# Patient Record
Sex: Female | Born: 1995 | Race: White | Hispanic: No | Marital: Married | Smoking: Never smoker
Health system: Southern US, Community
[De-identification: ages and names within clinical notes are randomized; demographics above are authoritative.]

## PROBLEM LIST (undated history)

## (undated) DIAGNOSIS — F419 Anxiety disorder, unspecified: Secondary | ICD-10-CM

---

## 2020-05-14 ENCOUNTER — Emergency Department (HOSPITAL_COMMUNITY)
Admission: EM | Admit: 2020-05-14 | Discharge: 2020-05-14 | Disposition: A | Discharge status: Home / Self Care | Payer: Federal, State, Local not specified - PPO | Attending: Emergency Medicine | Admitting: Emergency Medicine

## 2020-05-14 ENCOUNTER — Emergency Department (HOSPITAL_COMMUNITY): Payer: Federal, State, Local not specified - PPO

## 2020-05-14 ENCOUNTER — Encounter (HOSPITAL_COMMUNITY): Payer: Self-pay | Admitting: Emergency Medicine

## 2020-05-14 ENCOUNTER — Other Ambulatory Visit: Payer: Self-pay

## 2020-05-14 DIAGNOSIS — R200 Anesthesia of skin: Secondary | ICD-10-CM | POA: Diagnosis not present

## 2020-05-14 DIAGNOSIS — M25512 Pain in left shoulder: Secondary | ICD-10-CM | POA: Diagnosis not present

## 2020-05-14 DIAGNOSIS — R079 Chest pain, unspecified: Secondary | ICD-10-CM | POA: Diagnosis not present

## 2020-05-14 HISTORY — DX: Anxiety disorder, unspecified: F41.9

## 2020-05-14 LAB — BASIC METABOLIC PANEL
Anion gap: 10 (ref 5–15)
BUN: 11 mg/dL (ref 6–20)
CO2: 24 mmol/L (ref 22–32)
Calcium: 9.3 mg/dL (ref 8.9–10.3)
Chloride: 103 mmol/L (ref 98–111)
Creatinine, Ser: 0.8 mg/dL (ref 0.44–1.00)
GFR calc Af Amer: >60 mL/min (ref >60)
GFR calc non Af Amer: >60 mL/min (ref >60)
Glucose, Bld: 91 mg/dL (ref 70–99)
Potassium: 3.5 mmol/L (ref 3.5–5.1)
Sodium: 137 mmol/L (ref 135–145)

## 2020-05-14 LAB — TROPONIN I (HIGH SENSITIVITY)
Troponin I (High Sensitivity): <2 ng/L (ref <18)
Troponin I (High Sensitivity): <2 ng/L (ref <18)

## 2020-05-14 LAB — CBC
HCT: 42.2 % (ref 36.0–46.0)
Hemoglobin: 14.2 g/dL (ref 12.0–15.0)
MCH: 32.1 pg (ref 26.0–34.0)
MCHC: 33.6 g/dL (ref 30.0–36.0)
MCV: 95.3 fL (ref 80.0–100.0)
Platelets: 253 10*3/uL (ref 150–400)
RBC: 4.43 MIL/uL (ref 3.87–5.11)
RDW: 11.9 % (ref 11.5–15.5)
WBC: 6.3 10*3/uL (ref 4.0–10.5)
nRBC: 0 % (ref 0.0–0.2)

## 2020-05-14 LAB — I-STAT BETA HCG BLOOD, ED (MC, WL, AP ONLY): I-stat hCG, quantitative: <5 m[IU]/mL (ref <5)

## 2020-05-14 LAB — D-DIMER, QUANTITATIVE: D-Dimer, Quant: 1.2 ug/mL-FEU — ABNORMAL HIGH (ref 0.00–0.50)

## 2020-05-14 MED ORDER — SODIUM CHLORIDE 0.9% FLUSH: 3.0000 mL | Freq: Once | INTRAVENOUS | Status: DC

## 2020-05-14 MED ORDER — IOHEXOL 350 MG/ML SOLN
100.0000 mL | Freq: Once | INTRAVENOUS | Status: AC | PRN
  Administered 2020-05-14: 54 mL via INTRAVENOUS

## 2020-05-14 MED ORDER — SODIUM CHLORIDE 0.9 % IV SOLN: INTRAVENOUS | Status: DC

## 2020-05-14 NOTE — ED Provider Notes (Signed)
[facility] EMERGENCY DEPARTMENT Provider Note   CSN: [id number] Arrival date & time: 05/14/20  1319     History Chief Complaint  Patient presents with  . Chest Pain    Heather Guzman is a 24 y.o. female.  HPI   24yF with CP. Has been having CP since 2019. She has had more intense/persistent pain in past 1- 2 weeks. Pain is beneath L breast and into upper chest. Also pain in L shoulder and arm with numbness. Denies associated dyspnea, nausea, cough, diaphoresis. No unusual leg pain or swelling. No hx of DVT/PE. Is on OCP. Recent trip to [place] two weeks ago and drove.   Past Medical History:  Diagnosis Date  . Anxiety     There are no problems to display for this patient.   History reviewed. No pertinent surgical history.   OB History   No obstetric history on file.     No family history on file.  Social History   Tobacco Use  . Smoking status: Never Smoker  . Smokeless tobacco: Never Used  Substance Use Topics  . Alcohol use: Yes  . Drug use: Not Currently    Home Medications Prior to Admission medications   Not on File    Allergies    Patient has no allergy information on record.  Review of Systems   Review of Systems All systems reviewed and negative, other than as noted in HPI.  Physical Exam Updated Vital Signs BP (!) 135/93   Pulse (!) 59   Temp 98.1 F (36.7 C) (Oral)   Resp 15   LMP 05/12/2020 Comment: On birth control  SpO2 (!) 84%   Physical Exam Vitals and nursing note reviewed.  Constitutional:      General: She is not in acute distress.    Appearance: She is well-developed.  HENT:     Head: Normocephalic and atraumatic.  Eyes:     General:        Right eye: No discharge.        Left eye: No discharge.     Conjunctiva/sclera: Conjunctivae normal.  Cardiovascular:     Rate and Rhythm: Normal rate and regular rhythm.     Heart sounds: Normal heart sounds. No murmur heard.  No friction rub. No gallop.     Pulmonary:     Effort: Pulmonary effort is normal. No respiratory distress.     Breath sounds: Normal breath sounds.  Abdominal:     General: There is no distension.     Palpations: Abdomen is soft.     Tenderness: There is no abdominal tenderness.  Musculoskeletal:        General: No tenderness.     Cervical back: Neck supple.  Skin:    General: Skin is warm and dry.  Neurological:     Mental Status: She is alert.  Psychiatric:        Behavior: Behavior normal.        Thought Content: Thought content normal.     ED Results / Procedures / Treatments   Labs (all labs ordered are listed, but only abnormal results are displayed) Labs Reviewed  D-DIMER, QUANTITATIVE (NOT AT [facility]) - Abnormal; Notable for the following components:      Result Value   D-Dimer, Quant 1.20 (*)    All other components within normal limits  BASIC METABOLIC PANEL  CBC  I-STAT BETA HCG BLOOD, ED ([facility], [facility], [facility] ONLY)  TROPONIN I (HIGH SENSITIVITY)  TROPONIN I (  HIGH SENSITIVITY)    EKG EKG Interpretation  Date/Time:  Saturday May 14 2020 13:22:53 EDT Ventricular Rate:  100 PR Interval:  138 QRS Duration: 102 QT Interval:  344 QTC Calculation: 443 R Axis:   92 Text Interpretation: Normal sinus rhythm Rightward axis Incomplete right bundle branch block Confirmed by Raeford Razor [phone]) on 05/14/2020 4:30:10 PM   Radiology DG Chest 2 View  Result Date: 05/14/2020 CLINICAL DATA:  LEFT side chest pain, LEFT subscapular and upper arm pain off and on over 16 months EXAM: CHEST - 2 VIEW COMPARISON:  None FINDINGS: Normal heart size, mediastinal contours, and pulmonary vascularity. Azygos fissure noted. Lungs clear. No pulmonary infiltrate, pleural effusion or pneumothorax. RIGHT nipple shadow. No acute osseous findings. IMPRESSION: Normal exam. Electronically Signed   By: Ulyses Southward M.D.   On: 05/14/2020 14:13   CT Angio Chest PE W and/or Wo Contrast  Result Date: 05/14/2020 CLINICAL DATA:  PE  suspected, anxiety intermittent chest pain for 2 years EXAM: CT ANGIOGRAPHY CHEST WITH CONTRAST TECHNIQUE: Multidetector CT imaging of the chest was performed using the standard protocol during bolus administration of intravenous contrast. Multiplanar CT image reconstructions and MIPs were obtained to evaluate the vascular anatomy. CONTRAST:  102mL OMNIPAQUE IOHEXOL 350 MG/ML SOLN COMPARISON:  Radiograph 05/14/2020 FINDINGS: Cardiovascular: Satisfactory opacification the pulmonary arteries to the segmental level. No pulmonary artery filling defects are identified. Central pulmonary arteries are normal caliber. The aorta is normal caliber. The left vertebral artery arises directly from the aortic arch. The azygos vein runs within an accessory azygos fissure in the right upper lobe. Remaining major venous structures are unremarkable. Normal heart size. No pericardial effusion. Mediastinum/Nodes: Wedge-shaped soft tissue attenuation in the anterior mediastinum is most likely reflective of thymic remnant in a patient of this age. No mediastinal fluid or gas. Normal thyroid gland and thoracic inlet. No acute abnormality of the trachea or esophagus. No worrisome mediastinal, hilar or axillary adenopathy. Lungs/Pleura: Accessory azygos fissure, as detailed above. No consolidation, features of edema, pneumothorax, or effusion. No suspicious pulmonary nodules or masses. Upper Abdomen: No acute abnormalities present in the visualized portions of the upper abdomen. Musculoskeletal: Mild dextrocurvature of the thoracic spine apex T6-7. No acute osseous abnormality or suspicious osseous lesion. No worrisome chest wall lesions. Review of the MIP images confirms the above findings. IMPRESSION: 1. No evidence of pulmonary embolism. 2. No acute intrathoracic process. 3. 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 4. 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 Electronically Signed   By: Kreg Shropshire M.D.   On: 05/14/2020  19:25    Procedures Procedures (including critical care time)  Medications Ordered in ED Medications  sodium chloride flush (NS) 0.9 % injection 3 mL (3 mLs Intravenous Not Given 05/14/20 1637)  0.9 %  sodium chloride infusion ( Intravenous New Bag/Given 05/14/20 1903)  iohexol (OMNIPAQUE) 350 MG/ML injection 100 mL (54 mLs Intravenous Contrast Given 05/14/20 1918)    ED Course  I have reviewed the triage vital signs and the nursing notes.  Pertinent labs & imaging results that were available during my care of the patient were reviewed by me and considered in my medical decision making (see chart for details).    MDM Rules/Calculators/A&P                           24yF with CP and pain into LUE. Seems very atypical for ACS. D-dimer elevated but no sings/symptoms of  DVT and CTa negative for PE or other explanatory pathology. Consider cervical radiculopathy but I wouldn't expect her to have pain as low in her chest as she describes. Regardless, I doubt emergent process. Outpt FU for further eval if symptoms persist.   O2 saturation of 84% likely erroneous. Significant outlier of other readings and she has no dyspnea.  Final Clinical Impression(s) / ED Diagnoses Final diagnoses:  Chest pain, unspecified type    Rx / DC Orders ED Discharge Orders    None       Raeford Razor, MD 05/16/20 1530

## 2020-05-14 NOTE — ED Triage Notes (Signed)
Reports anxiety and intermittent chest pain x 2 years.  Pain worse over the last 2 weeks with L arm numbness and pain under L breast.  Denies SOB, nausea, and vomiting.

## 2020-05-14 NOTE — ED Notes (Signed)
Pt verbalized understanding of d/c instructions, follow up acre and s/s requiring return to ed. Pt had no further questions and was transported to exit via wheelchair.  

## 2020-05-14 NOTE — ED Notes (Signed)
Patient transported to CT 

## 2022-01-27 IMAGING — CT CT ANGIO CHEST
2 of 6 series · 18 of 36 positions shown · IV contrast (omnipaque)
Comparison: Radiograph 05/14/2020

CLINICAL DATA: PE suspected, anxiety intermittent chest pain for 2
years

EXAM:
CT ANGIOGRAPHY CHEST WITH CONTRAST
TECHNIQUE: Multidetector CT imaging of the chest was performed using the
standard protocol during bolus administration of intravenous
contrast. Multiplanar CT image reconstructions and MIPs were
obtained to evaluate the vascular anatomy.
CONTRAST:  54mL OMNIPAQUE IOHEXOL 350 MG/ML SOLN

[Series 7: pe thins · axial · 0.68mm/px · z∈[-374,-95]mm · 17 of 444 slices shown]
[im 23/444  lung]
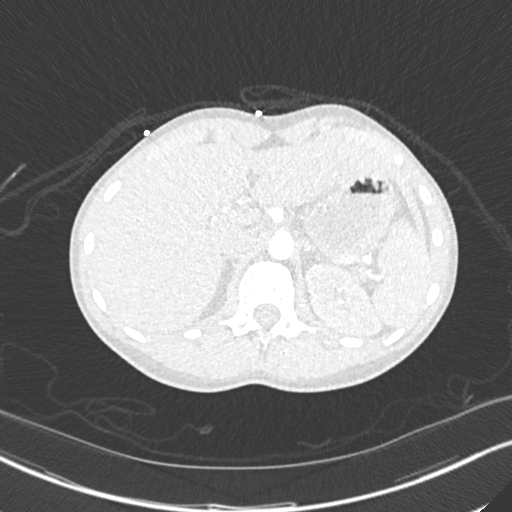
[im 45/444  mediastinal]
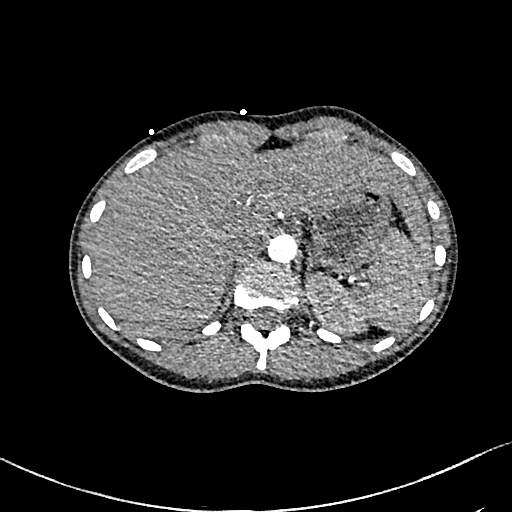
[im 67/444  lung]
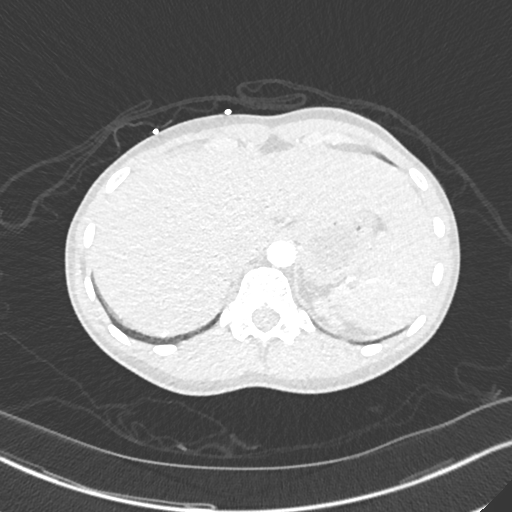
[im 89/444  mediastinal]
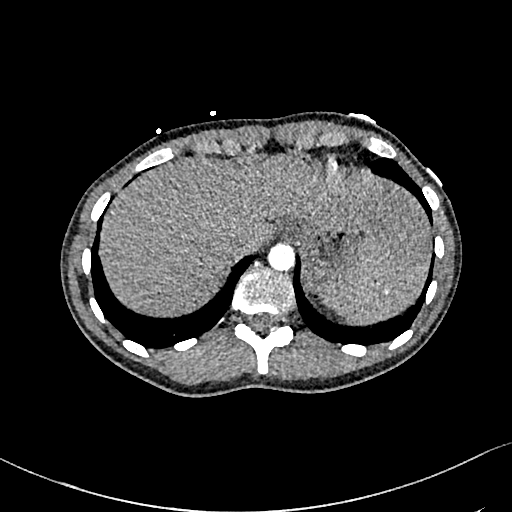
[im 133/444  lung]
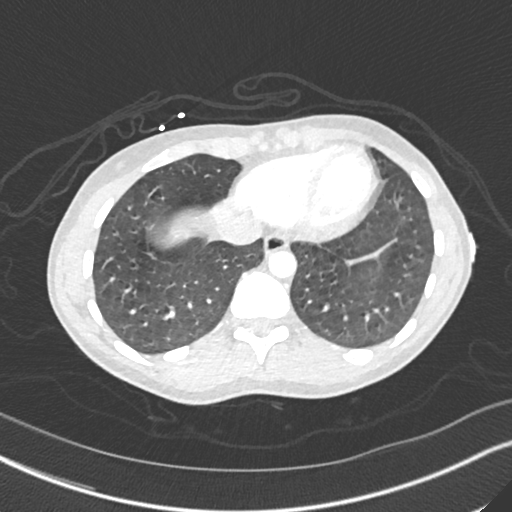
[im 156/444  mediastinal]
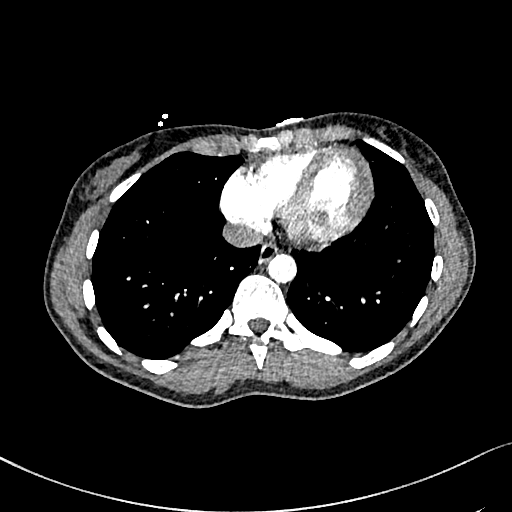
[im 178/444  lung]
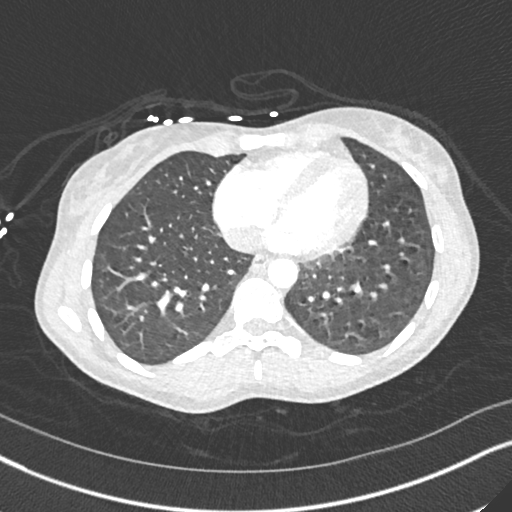
[im 200/444  mediastinal]
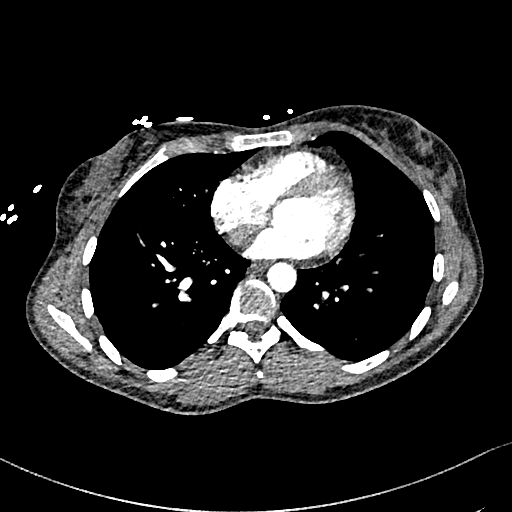
[im 222/444  lung]
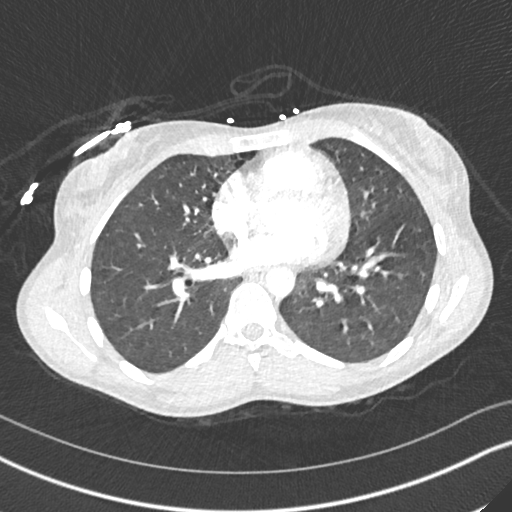
[im 244/444  mediastinal]
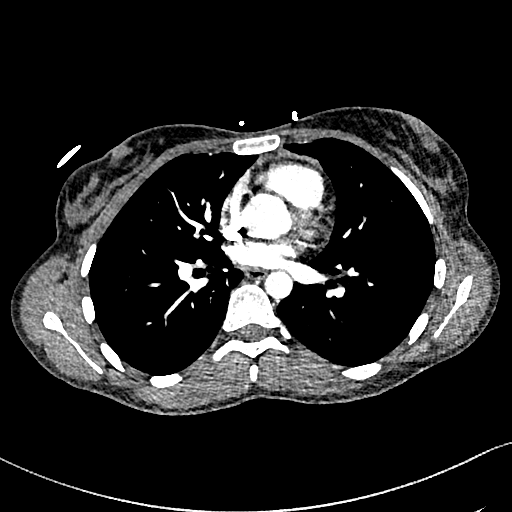
[im 266/444  lung]
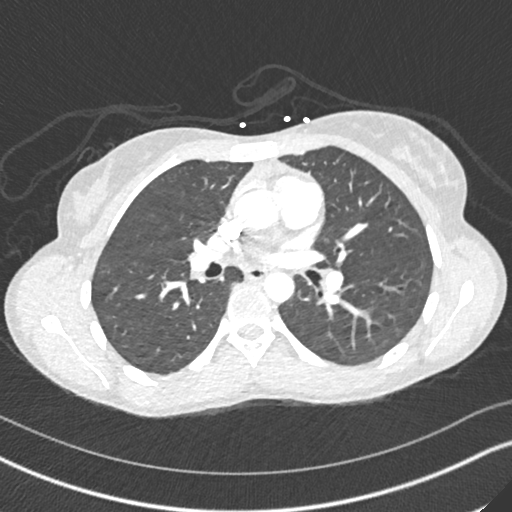
[im 288/444  mediastinal]
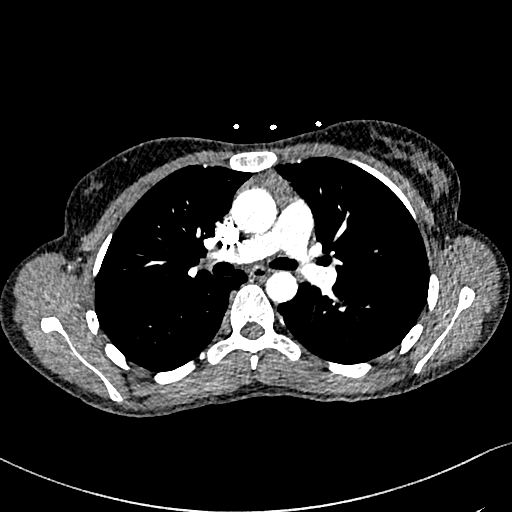
[im 311/444  lung]
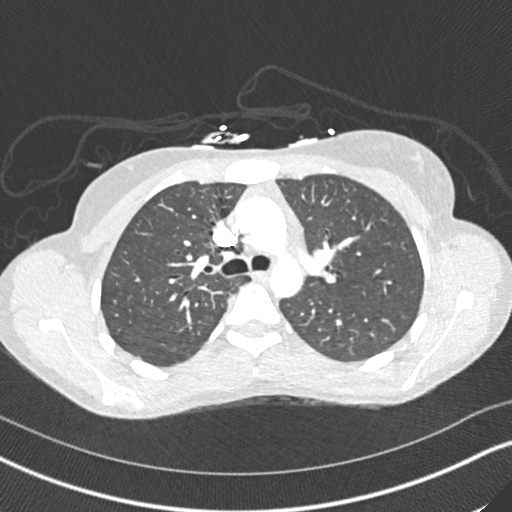
[im 355/444  mediastinal]
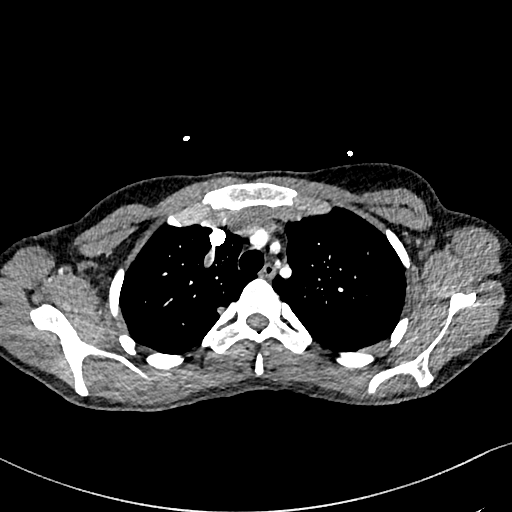
[im 377/444  lung]
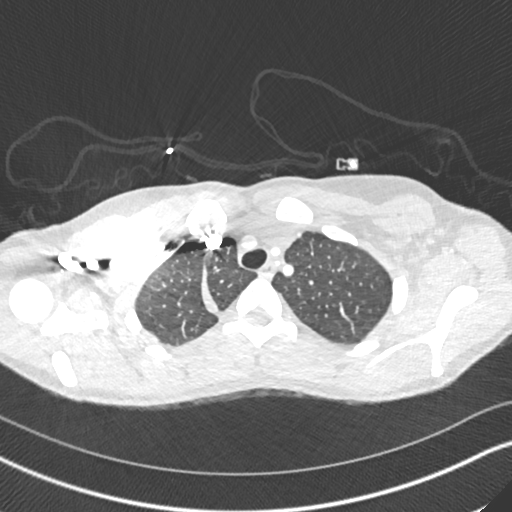
[im 399/444  mediastinal]
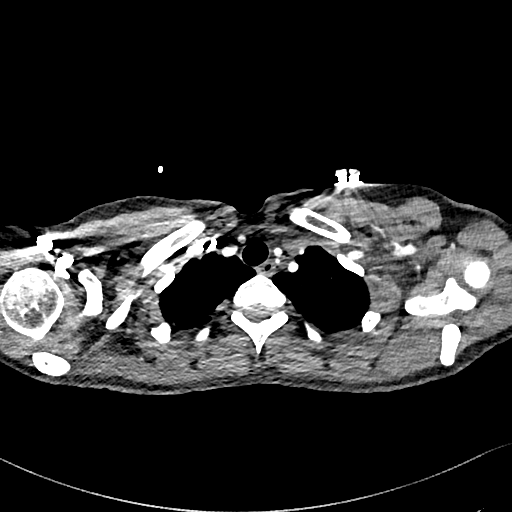
[im 421/444  lung]
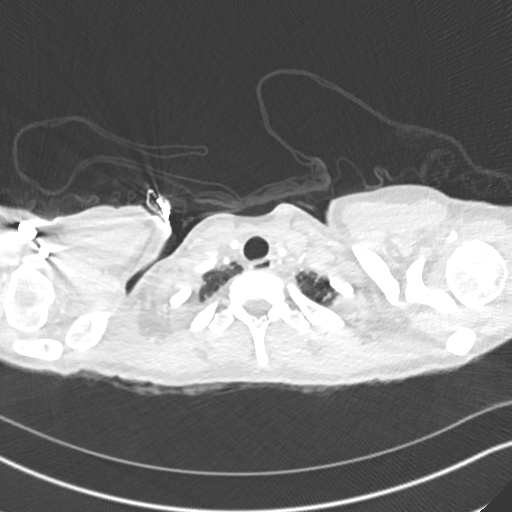

[Series 8: pe 2mm cor · coronal · 0.61mm/px · 1 of 108 slices shown]
[im 54/108  mediastinal]
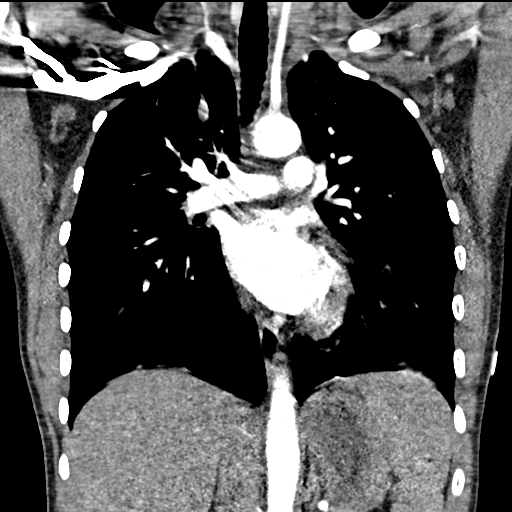

[18 of 36 positions shown; findings below may reference images not displayed]

FINDINGS: Cardiovascular: Satisfactory opacification the pulmonary arteries to
the segmental level. No pulmonary artery filling defects are
identified. Central pulmonary arteries are normal caliber. The aorta
is normal caliber. The left vertebral artery arises directly from
the aortic arch. The azygos vein runs within an accessory azygos
fissure in the right upper lobe. Remaining major venous structures
are unremarkable. Normal heart size. No pericardial effusion.

Mediastinum/Nodes: Wedge-shaped soft tissue attenuation in the
anterior mediastinum is most likely reflective of thymic remnant in
a patient of this age. No mediastinal fluid or gas. Normal thyroid
gland and thoracic inlet. No acute abnormality of the trachea or
esophagus. No worrisome mediastinal, hilar or axillary adenopathy.

Lungs/Pleura: Accessory azygos fissure, as detailed above. No
consolidation, features of edema, pneumothorax, or effusion. No
suspicious pulmonary nodules or masses.

Upper Abdomen: No acute abnormalities present in the visualized
portions of the upper abdomen.

Musculoskeletal: Mild dextrocurvature of the thoracic spine apex
T6-7. No acute osseous abnormality or suspicious osseous lesion. No
worrisome chest wall lesions.

Review of the MIP images confirms the above findings.
IMPRESSION: 1. No evidence of pulmonary embolism.
2. No acute intrathoracic process.
3. Accessory azygos fissure in the right upper lobe.
4. Likely thymic remnant in the anterior mediastinum.
